# Patient Record
Sex: Male | Born: 1975 | Race: Black or African American | Hispanic: No | Marital: Single | Smoking: Former smoker
Health system: Southern US, Community
[De-identification: ages and names within clinical notes are randomized; demographics above are authoritative.]

---

## 2019-10-20 ENCOUNTER — Emergency Department (HOSPITAL_COMMUNITY): Payer: Self-pay

## 2019-10-20 ENCOUNTER — Encounter (HOSPITAL_COMMUNITY): Payer: Self-pay | Admitting: Emergency Medicine

## 2019-10-20 ENCOUNTER — Other Ambulatory Visit: Payer: Self-pay

## 2019-10-20 ENCOUNTER — Emergency Department (HOSPITAL_COMMUNITY)
Admission: EM | Admit: 2019-10-20 | Discharge: 2019-10-21 | Disposition: A | Discharge status: Home / Self Care | Payer: Self-pay | Attending: Emergency Medicine | Admitting: Emergency Medicine

## 2019-10-20 DIAGNOSIS — R0602 Shortness of breath: Secondary | ICD-10-CM | POA: Insufficient documentation

## 2019-10-20 DIAGNOSIS — R05 Cough: Secondary | ICD-10-CM | POA: Insufficient documentation

## 2019-10-20 DIAGNOSIS — R509 Fever, unspecified: Secondary | ICD-10-CM | POA: Insufficient documentation

## 2019-10-20 DIAGNOSIS — Z5321 Procedure and treatment not carried out due to patient leaving prior to being seen by health care provider: Secondary | ICD-10-CM | POA: Insufficient documentation

## 2019-10-20 LAB — COMPREHENSIVE METABOLIC PANEL
ALT: 16 U/L (ref 0–44)
AST: 18 U/L (ref 15–41)
Albumin: 2 g/dL — ABNORMAL LOW (ref 3.5–5.0)
Alkaline Phosphatase: 67 U/L (ref 38–126)
Anion gap: 9 (ref 5–15)
BUN: 10 mg/dL (ref 6–20)
CO2: 24 mmol/L (ref 22–32)
Calcium: 8.3 mg/dL — ABNORMAL LOW (ref 8.9–10.3)
Chloride: 99 mmol/L (ref 98–111)
Creatinine, Ser: 0.79 mg/dL (ref 0.61–1.24)
GFR calc Af Amer: >60 mL/min (ref >60)
GFR calc non Af Amer: >60 mL/min (ref >60)
Glucose, Bld: 87 mg/dL (ref 70–99)
Potassium: 3.7 mmol/L (ref 3.5–5.1)
Sodium: 132 mmol/L — ABNORMAL LOW (ref 135–145)
Total Bilirubin: 0.5 mg/dL (ref 0.3–1.2)
Total Protein: 9 g/dL — ABNORMAL HIGH (ref 6.5–8.1)

## 2019-10-20 LAB — CBC WITH DIFFERENTIAL/PLATELET
Abs Immature Granulocytes: 0.06 10*3/uL (ref 0.00–0.07)
Basophils Absolute: 0.1 10*3/uL (ref 0.0–0.1)
Basophils Relative: 0 %
Eosinophils Absolute: 0 10*3/uL (ref 0.0–0.5)
Eosinophils Relative: 0 %
HCT: 27.8 % — ABNORMAL LOW (ref 39.0–52.0)
Hemoglobin: 8.6 g/dL — ABNORMAL LOW (ref 13.0–17.0)
Immature Granulocytes: 0 %
Lymphocytes Relative: 29 %
Lymphs Abs: 3.9 10*3/uL (ref 0.7–4.0)
MCH: 20.7 pg — ABNORMAL LOW (ref 26.0–34.0)
MCHC: 30.9 g/dL (ref 30.0–36.0)
MCV: 67 fL — ABNORMAL LOW (ref 80.0–100.0)
Monocytes Absolute: 1.1 10*3/uL — ABNORMAL HIGH (ref 0.1–1.0)
Monocytes Relative: 8 %
Neutro Abs: 8.4 10*3/uL — ABNORMAL HIGH (ref 1.7–7.7)
Neutrophils Relative %: 63 %
Platelets: 431 10*3/uL — ABNORMAL HIGH (ref 150–400)
RBC: 4.15 MIL/uL — ABNORMAL LOW (ref 4.22–5.81)
RDW: 17.9 % — ABNORMAL HIGH (ref 11.5–15.5)
WBC: 13.6 10*3/uL — ABNORMAL HIGH (ref 4.0–10.5)
nRBC: 0 % (ref 0.0–0.2)

## 2019-10-20 LAB — LACTIC ACID, PLASMA: Lactic Acid, Venous: 1 mmol/L (ref 0.5–1.9)

## 2019-10-20 MED ORDER — SODIUM CHLORIDE 0.9% FLUSH: 3.0000 mL | Freq: Once | INTRAVENOUS | Status: DC

## 2019-10-20 MED ORDER — ACETAMINOPHEN 325 MG PO TABS
650.0000 mg | ORAL_TABLET | Freq: Once | ORAL | Status: AC | PRN
  Administered 2019-10-20: 19:00:00 650 mg via ORAL
  Filled 2019-10-20: qty 2

## 2019-10-20 NOTE — ED Triage Notes (Signed)
Pt states he was seen at [facility] ER yesterday and told to come here for admission for pneumonia.  Reports SOB, productive cough with clear phlegm, and fever x 5 days.

## 2021-02-02 IMAGING — CR DG CHEST 2V
2 series · 2 of 2 positions shown · non-contrast
Comparison: None.

CLINICAL DATA: Shortness of breath

EXAM:
CHEST - 2 VIEW

[chest pa]
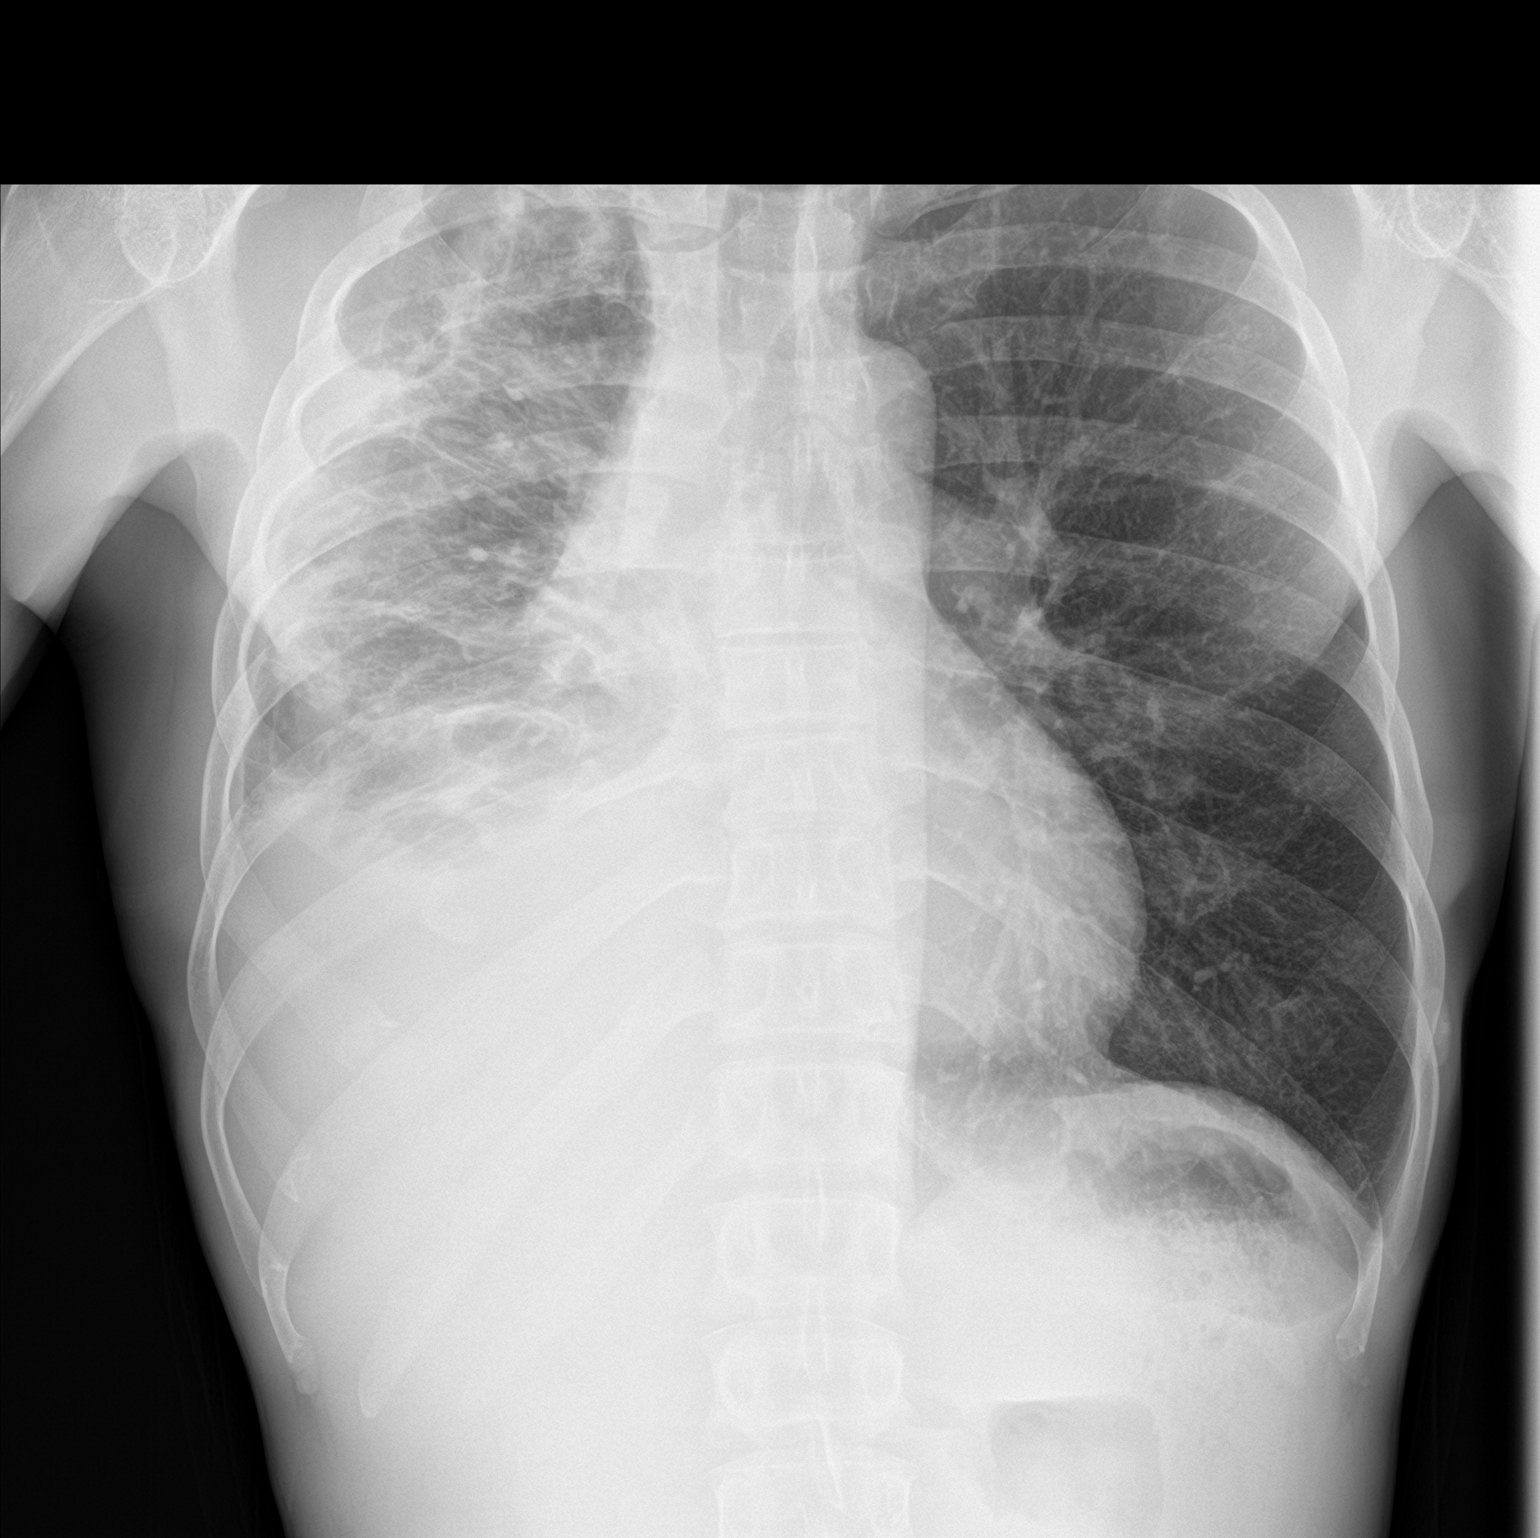

[chest lat]
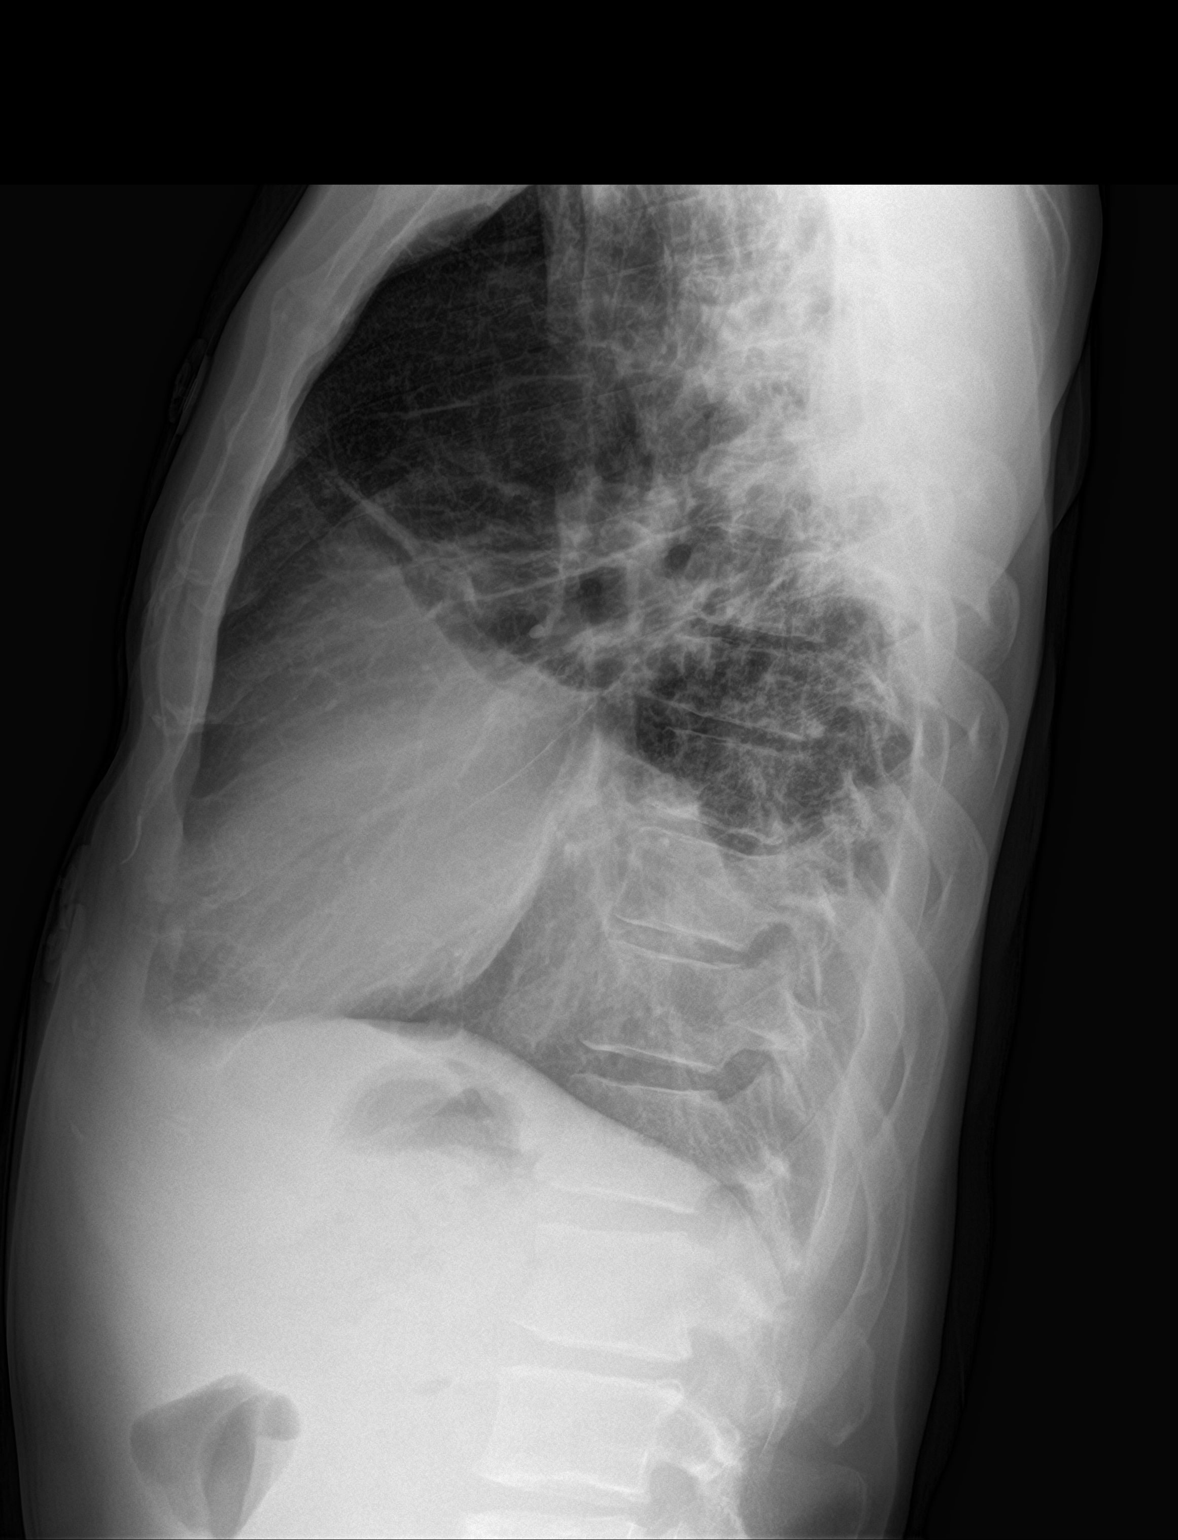

[2 of 2 positions shown; findings below may reference images not displayed]

FINDINGS: There is a large right pleural effusion. Mixed consolidative and
reticular changes some of which likely reflects a degree of
architectural distortion are present throughout the right lung. This
opacity obscures the right heart border with increased density along
the right paratracheal line possibly reflecting volume loss and/or
distributed pleural fluid though overall indeterminate. The left
lung is clear. Left cardiomediastinal contours are unremarkable.
IMPRESSION: Large right pleural effusion with mixed opacity and architectural
change in the right lung. Consider further evaluation with
cross-sectional imaging.

## 2024-02-11 NOTE — Progress Notes (Deleted)
   [facility] clinic  Provider:  Jereld Serum DNP  Code Status:  Full Code  Goals of Care:      No data to display           No chief complaint on file.   Discussed the use of AI scribe software for clinical note transcription with the patient, who gave verbal consent to proceed.  HPI: Patient is a 48 y.o. male seen today fto establish care with [facility].     No past medical history on file.  No past surgical history on file.  Not on File  No outpatient encounter medications on file as of 02/12/2024.   No facility-administered encounter medications on file as of 02/12/2024.    Review of Systems:  Review of Systems  Health Maintenance  Topic Date Due   HIV Screening  Never done   Hepatitis C Screening  Never done   DTaP/Tdap/Td (1 - Tdap) Never done   Hepatitis B Vaccines (1 of 3 - 19+ 3-dose series) Never done   Colonoscopy  Never done   COVID-19 Vaccine (1 - 2024-25 season) Never done   INFLUENZA VACCINE  02/02/2024   Pneumococcal Vaccine: 19-49 Years  Aged Out   HPV VACCINES  Aged Out   Meningococcal B Vaccine  Aged Out    Physical Exam: There were no vitals filed for this visit. There is no height or weight on file to calculate BMI. Physical Exam  Labs reviewed: Basic Metabolic Panel: No results for input(s): NA, K, CL, CO2, GLUCOSE, BUN, CREATININE, CALCIUM, MG, PHOS, TSH in the last 8760 hours. Liver Function Tests: No results for input(s): AST, ALT, ALKPHOS, BILITOT, PROT, ALBUMIN in the last 8760 hours. No results for input(s): LIPASE, AMYLASE in the last 8760 hours. No results for input(s): AMMONIA in the last 8760 hours. CBC: No results for input(s): WBC, NEUTROABS, HGB, HCT, MCV, PLT in the last 8760 hours. Lipid Panel: No results for input(s): CHOL, HDL, LDLCALC, TRIG, CHOLHDL, LDLDIRECT in the last 8760 hours. No results found for: HGBA1C  Procedures since last visit: No  results found.  Assessment/Plan Assessment and Plan Assessment & Plan     ***   Labs/tests ordered:  * No order type specified *   No follow-ups on file.  Daphne Karrer Medina-Vargas, NP

## 2024-02-12 ENCOUNTER — Ambulatory Visit: Admitting: Adult Health

## 2024-02-12 DIAGNOSIS — Z125 Encounter for screening for malignant neoplasm of prostate: Secondary | ICD-10-CM

## 2024-02-12 DIAGNOSIS — Z7689 Persons encountering health services in other specified circumstances: Secondary | ICD-10-CM

## 2024-02-12 DIAGNOSIS — Z1211 Encounter for screening for malignant neoplasm of colon: Secondary | ICD-10-CM

## 2024-02-12 DIAGNOSIS — Z113 Encounter for screening for infections with a predominantly sexual mode of transmission: Secondary | ICD-10-CM

## 2024-02-15 ENCOUNTER — Other Ambulatory Visit: Payer: Self-pay

## 2024-02-15 ENCOUNTER — Emergency Department (HOSPITAL_COMMUNITY)

## 2024-02-15 ENCOUNTER — Emergency Department (HOSPITAL_COMMUNITY): Admission: EM | Admit: 2024-02-15 | Discharge: 2024-02-15 | Disposition: A | Discharge status: Home / Self Care

## 2024-02-15 ENCOUNTER — Encounter (HOSPITAL_COMMUNITY): Payer: Self-pay | Admitting: Emergency Medicine

## 2024-02-15 DIAGNOSIS — S50312A Abrasion of left elbow, initial encounter: Secondary | ICD-10-CM | POA: Insufficient documentation

## 2024-02-15 DIAGNOSIS — Y92410 Unspecified street and highway as the place of occurrence of the external cause: Secondary | ICD-10-CM | POA: Diagnosis not present

## 2024-02-15 DIAGNOSIS — S8002XA Contusion of left knee, initial encounter: Secondary | ICD-10-CM | POA: Insufficient documentation

## 2024-02-15 DIAGNOSIS — T07XXXA Unspecified multiple injuries, initial encounter: Secondary | ICD-10-CM

## 2024-02-15 DIAGNOSIS — Z23 Encounter for immunization: Secondary | ICD-10-CM | POA: Diagnosis not present

## 2024-02-15 DIAGNOSIS — S60222A Contusion of left hand, initial encounter: Secondary | ICD-10-CM | POA: Insufficient documentation

## 2024-02-15 DIAGNOSIS — S0033XA Contusion of nose, initial encounter: Secondary | ICD-10-CM | POA: Diagnosis not present

## 2024-02-15 DIAGNOSIS — J3489 Other specified disorders of nose and nasal sinuses: Secondary | ICD-10-CM | POA: Diagnosis present

## 2024-02-15 MED ORDER — OXYCODONE-ACETAMINOPHEN 5-325 MG PO TABS
1.0000 | ORAL_TABLET | Freq: Once | ORAL | Status: AC
  Administered 2024-02-15: 1 via ORAL
  Filled 2024-02-15: qty 1

## 2024-02-15 MED ORDER — CYCLOBENZAPRINE HCL 10 MG PO TABS: 10.0000 mg | ORAL_TABLET | Freq: Two times a day (BID) | ORAL | 0 refills | Status: AC | PRN

## 2024-02-15 MED ORDER — TETANUS-DIPHTH-ACELL PERTUSSIS 5-2.5-18.5 LF-MCG/0.5 IM SUSY
0.5000 mL | PREFILLED_SYRINGE | Freq: Once | INTRAMUSCULAR | Status: AC
Start: 2024-02-15 — End: 2024-02-15
  Administered 2024-02-15: 0.5 mL via INTRAMUSCULAR
  Filled 2024-02-15: qty 0.5

## 2024-02-15 MED ORDER — IBUPROFEN 600 MG PO TABS
600.0000 mg | ORAL_TABLET | Freq: Four times a day (QID) | ORAL | 0 refills | Status: AC | PRN
Start: 2024-02-15 — End: ?
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
## 2024-02-15 NOTE — ED Notes (Signed)
 Wound to left leg cleaned and dressing applied. JRPRN
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
## 2024-02-15 NOTE — Progress Notes (Signed)
 Orthopedic Tech Progress Note Patient Details:  Thomas Christian 02-16-1976 [id number]  Ortho Devices Type of Ortho Device: Knee Sleeve Ortho Device/Splint Location: LLE for Knee sleeve, LUE for splint Ortho Device/Splint Interventions: Ordered, Application, Adjustment, Other (comment)   Post Interventions Patient Tolerated: Well Instructions Provided: Adjustment of device, Care of device Gave patient hinged knee sleeve on left leg and explained how to properly use hinged knee sleeve.  When I was about to apply the splint, the patient stated that he did not need the splint. After explaining that the splint was ordered for concern of potential fracture and also suggesting that I could apply the splint and then the patient could chat with PA about why it is needed/future steps, patient stated that they would take off splint if I applied it. Patient refused splint completely.  Thomas Christian 02/15/2024, 7:54 PM
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
